# Patient Record
Sex: Female | Born: 1935 | Race: White | Hispanic: Refuse to answer | Smoking: Never smoker
Health system: Southern US, Community
[De-identification: ages and names within clinical notes are randomized; demographics above are authoritative.]

## PROBLEM LIST (undated history)

## (undated) DIAGNOSIS — M1990 Unspecified osteoarthritis, unspecified site: Secondary | ICD-10-CM

## (undated) DIAGNOSIS — K219 Gastro-esophageal reflux disease without esophagitis: Secondary | ICD-10-CM

## (undated) DIAGNOSIS — M549 Dorsalgia, unspecified: Secondary | ICD-10-CM

## (undated) DIAGNOSIS — F419 Anxiety disorder, unspecified: Secondary | ICD-10-CM

## (undated) DIAGNOSIS — I499 Cardiac arrhythmia, unspecified: Secondary | ICD-10-CM

## (undated) DIAGNOSIS — E079 Disorder of thyroid, unspecified: Secondary | ICD-10-CM

## (undated) HISTORY — PX: ABDOMINAL HYSTERECTOMY: SHX81

## (undated) HISTORY — PX: COLONOSCOPY: SHX174

## (undated) HISTORY — PX: CARDIAC SURGERY: SHX584

## (undated) HISTORY — PX: INNER EAR SURGERY: SHX679

## (undated) HISTORY — PX: WRIST SURGERY: SHX841

## (undated) HISTORY — PX: EYE SURGERY: SHX253

---

## 1990-11-14 HISTORY — PX: BREAST BIOPSY: SHX20

## 2004-11-05 ENCOUNTER — Ambulatory Visit: Payer: Self-pay | Admitting: Family Medicine

## 2005-12-23 ENCOUNTER — Ambulatory Visit: Payer: Self-pay | Admitting: Family Medicine

## 2007-01-01 ENCOUNTER — Ambulatory Visit: Payer: Self-pay | Admitting: Family Medicine

## 2008-01-14 ENCOUNTER — Ambulatory Visit: Payer: Self-pay | Admitting: Family Medicine

## 2009-01-19 ENCOUNTER — Ambulatory Visit: Payer: Self-pay | Admitting: Family Medicine

## 2009-10-23 ENCOUNTER — Ambulatory Visit: Payer: Self-pay | Admitting: Internal Medicine

## 2010-01-22 ENCOUNTER — Ambulatory Visit: Payer: Self-pay | Admitting: Family Medicine

## 2010-11-25 HISTORY — PX: DG  BONE DENSITY (ARMC HX): HXRAD1102

## 2011-01-24 ENCOUNTER — Ambulatory Visit: Payer: Self-pay | Admitting: Internal Medicine

## 2011-01-24 ENCOUNTER — Ambulatory Visit: Payer: Self-pay | Admitting: Family Medicine

## 2011-02-21 ENCOUNTER — Ambulatory Visit: Payer: Self-pay | Admitting: Internal Medicine

## 2011-02-24 ENCOUNTER — Ambulatory Visit: Payer: Self-pay | Admitting: Internal Medicine

## 2011-03-26 ENCOUNTER — Ambulatory Visit: Payer: Self-pay | Admitting: Internal Medicine

## 2011-05-28 ENCOUNTER — Ambulatory Visit: Payer: Self-pay | Admitting: Internal Medicine

## 2011-06-26 ENCOUNTER — Ambulatory Visit: Payer: Self-pay | Admitting: Internal Medicine

## 2012-01-28 ENCOUNTER — Ambulatory Visit: Payer: Self-pay | Admitting: Family Medicine

## 2013-01-28 ENCOUNTER — Ambulatory Visit: Payer: Self-pay | Admitting: Family Medicine

## 2013-07-25 ENCOUNTER — Ambulatory Visit: Payer: Self-pay | Admitting: Family Medicine

## 2013-07-27 LAB — BETA STREP CULTURE(ARMC)

## 2014-02-01 ENCOUNTER — Ambulatory Visit: Payer: Self-pay | Admitting: Family Medicine

## 2015-02-07 ENCOUNTER — Ambulatory Visit: Payer: Self-pay | Admitting: Internal Medicine

## 2015-04-16 ENCOUNTER — Encounter: Payer: Self-pay | Admitting: Gynecology

## 2015-04-16 ENCOUNTER — Ambulatory Visit: Payer: Medicare Other

## 2015-04-16 ENCOUNTER — Ambulatory Visit
Admission: EM | Admit: 2015-04-16 | Discharge: 2015-04-16 | Disposition: A | Discharge status: Home / Self Care | Payer: Medicare Other | Attending: Family Medicine | Admitting: Family Medicine

## 2015-04-16 DIAGNOSIS — T148XXA Other injury of unspecified body region, initial encounter: Secondary | ICD-10-CM

## 2015-04-16 DIAGNOSIS — W19XXXA Unspecified fall, initial encounter: Secondary | ICD-10-CM | POA: Diagnosis not present

## 2015-04-16 DIAGNOSIS — T148 Other injury of unspecified body region: Secondary | ICD-10-CM

## 2015-04-16 DIAGNOSIS — M545 Low back pain: Secondary | ICD-10-CM | POA: Diagnosis present

## 2015-04-16 DIAGNOSIS — S300XXA Contusion of lower back and pelvis, initial encounter: Secondary | ICD-10-CM | POA: Diagnosis not present

## 2015-04-16 HISTORY — DX: Gastro-esophageal reflux disease without esophagitis: K21.9

## 2015-04-16 HISTORY — DX: Unspecified osteoarthritis, unspecified site: M19.90

## 2015-04-16 HISTORY — DX: Anxiety disorder, unspecified: F41.9

## 2015-04-16 HISTORY — DX: Dorsalgia, unspecified: M54.9

## 2015-04-16 HISTORY — DX: Disorder of thyroid, unspecified: E07.9

## 2015-04-16 HISTORY — DX: Cardiac arrhythmia, unspecified: I49.9

## 2015-04-16 NOTE — Discharge Instructions (Signed)
Ice when necessary, cushion when sitting, Tylenol/Aleve when necessary, seek medical attention if symptoms persist or worsen as discussed.
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
## 2015-09-26 ENCOUNTER — Encounter: Payer: Self-pay | Admitting: Internal Medicine

## 2015-09-26 DIAGNOSIS — M47816 Spondylosis without myelopathy or radiculopathy, lumbar region: Secondary | ICD-10-CM | POA: Insufficient documentation

## 2015-09-26 DIAGNOSIS — E039 Hypothyroidism, unspecified: Secondary | ICD-10-CM | POA: Insufficient documentation

## 2015-09-26 DIAGNOSIS — D72819 Decreased white blood cell count, unspecified: Secondary | ICD-10-CM | POA: Insufficient documentation

## 2015-09-26 DIAGNOSIS — H3530 Unspecified macular degeneration: Secondary | ICD-10-CM | POA: Insufficient documentation

## 2015-09-26 DIAGNOSIS — IMO0001 Reserved for inherently not codable concepts without codable children: Secondary | ICD-10-CM | POA: Insufficient documentation

## 2015-09-26 DIAGNOSIS — F411 Generalized anxiety disorder: Secondary | ICD-10-CM | POA: Insufficient documentation

## 2015-09-26 DIAGNOSIS — E785 Hyperlipidemia, unspecified: Secondary | ICD-10-CM | POA: Insufficient documentation

## 2015-09-26 DIAGNOSIS — K219 Gastro-esophageal reflux disease without esophagitis: Secondary | ICD-10-CM | POA: Insufficient documentation

## 2015-09-26 DIAGNOSIS — R030 Elevated blood-pressure reading, without diagnosis of hypertension: Secondary | ICD-10-CM

## 2015-09-26 DIAGNOSIS — N6019 Diffuse cystic mastopathy of unspecified breast: Secondary | ICD-10-CM | POA: Insufficient documentation

## 2015-11-21 ENCOUNTER — Ambulatory Visit
Admission: EM | Admit: 2015-11-21 | Discharge: 2015-11-21 | Disposition: A | Discharge status: Home / Self Care | Payer: Medicare Other | Attending: Family Medicine | Admitting: Family Medicine

## 2015-11-21 DIAGNOSIS — R030 Elevated blood-pressure reading, without diagnosis of hypertension: Secondary | ICD-10-CM | POA: Diagnosis not present

## 2015-11-21 DIAGNOSIS — R309 Painful micturition, unspecified: Secondary | ICD-10-CM | POA: Insufficient documentation

## 2015-11-21 DIAGNOSIS — N390 Urinary tract infection, site not specified: Secondary | ICD-10-CM | POA: Insufficient documentation

## 2015-11-21 DIAGNOSIS — IMO0001 Reserved for inherently not codable concepts without codable children: Secondary | ICD-10-CM

## 2015-11-21 LAB — URINALYSIS COMPLETE WITH MICROSCOPIC (ARMC ONLY)
BILIRUBIN URINE: NEGATIVE
Glucose, UA: NEGATIVE mg/dL
Ketones, ur: NEGATIVE mg/dL
Nitrite: NEGATIVE
Protein, ur: NEGATIVE mg/dL
Specific Gravity, Urine: 1.02 (ref 1.005–1.030)
pH: 6 (ref 5.0–8.0)

## 2015-11-21 MED ORDER — PHENAZOPYRIDINE HCL 200 MG PO TABS: 200.0000 mg | ORAL_TABLET | Freq: Three times a day (TID) | ORAL | Status: DC | PRN

## 2015-11-21 MED ORDER — NITROFURANTOIN MONOHYD MACRO 100 MG PO CAPS: 100.0000 mg | ORAL_CAPSULE | Freq: Two times a day (BID) | ORAL | Status: AC

## 2015-11-21 NOTE — ED Provider Notes (Addendum)
CSN: [id number]     Arrival date & time 11/21/15  0901 History   First MD Initiated Contact with Patient 11/21/15 [phone]    Nurses notes were reviewed. Chief Complaint  Patient presents with  . Urinary Tract Infection   patient's here because of UTI. She reports not having a UTI in quite a while. She does admit to some time wiping out and I recommend that she always write down. States this started about Christmas Eve and for 3 days as been intermittent but today she woke up and has been much stronger and more persistent. (Consider location/radiation/quality/duration/timing/severity/associated sxs/prior Treatment) Patient is a 81 y.o. female presenting with urinary tract infection. The history is provided by the patient. No language interpreter was used.  Urinary Tract Infection Pain quality:  Sharp and burning Pain severity:  Moderate Progression:  Worsening Chronicity:  New Recent urinary tract infections: no   Relieved by:  Nothing Worsened by:  Nothing tried Urinary symptoms: frequent urination   Associated symptoms: no abdominal pain, no fever and no vaginal discharge   Risk factors: no hx of pyelonephritis, no kidney transplant, not pregnant, not sexually active and no sexually transmitted infections     Past Medical History  Diagnosis Date  . Thyroid disease   . GERD (gastroesophageal reflux disease)   . Anxiety   . Back pain     chronic  . Arthritis     hands, knees and back  . Arrhythmia    Past Surgical History  Procedure Laterality Date  . Breast biopsy Right     benign  . Wrist surgery Right   . Abdominal hysterectomy      total for endometriosis  . Cardiac surgery      catheterization  . Inner ear surgery Bilateral     cochlear implants  . Colonoscopy    . Dg  bone density ([facility] hx)  2012  . Eye surgery      bilateral catatact   Family History  Problem Relation Age of Onset  . Breast cancer Sister   . CAD Father    Social History  Substance Use Topics   . Smoking status: Never Smoker   . Smokeless tobacco: None  . Alcohol Use: No   OB History    No data available     Review of Systems  Constitutional: Negative for fever.  Gastrointestinal: Negative for abdominal pain.  Genitourinary: Negative for vaginal discharge.    Allergies  Raloxifene  Home Medications   Prior to Admission medications   Medication Sig Start Date End Date Taking? Authorizing Provider  ALPRAZolam Prudy Feeler) 0.25 MG tablet Take 0.25 mg by mouth at bedtime as needed for anxiety.   Yes Historical Provider, MD  aspirin 81 MG tablet Take 81 mg by mouth daily.   Yes Historical Provider, MD  calcium acetate (PHOSLO) 667 MG capsule Take by mouth 3 (three) times daily with meals.   Yes Historical Provider, MD  levothyroxine (SYNTHROID, LEVOTHROID) 75 MCG tablet Take 75 mcg by mouth daily before breakfast.   Yes Historical Provider, MD  Multiple Vitamins-Minerals (RA VISION-VITE PRESERVE PO) Take by mouth.   Yes Historical Provider, MD  omeprazole (PRILOSEC) 40 MG capsule Take 40 mg by mouth daily.   Yes Historical Provider, MD  TURMERIC PO Take by mouth.   Yes Historical Provider, MD  nitrofurantoin, macrocrystal-monohydrate, (MACROBID) 100 MG capsule Take 1 capsule (100 mg total) by mouth 2 (two) times daily. 11/21/15   Hassan Rowan, MD  phenazopyridine (  PYRIDIUM) 200 MG tablet Take 1 tablet (200 mg total) by mouth 3 (three) times daily as needed for pain. 11/21/15   Hassan RowanEugene Naevia Unterreiner, MD   Meds Ordered and Administered this Visit  Medications - No data to display  BP 172/98 mmHg  Pulse 72  Temp(Src) 96.6 F (35.9 C) (Tympanic)  Resp 16  Ht 5\' 3"  (1.6 m)  Wt 136 lb (61.689 kg)  BMI 24.10 kg/m2  SpO2 98% No data found.   Physical Exam  Constitutional: She is oriented to person, place, and time. She appears well-developed and well-nourished.  HENT:  Head: Normocephalic.  Eyes: Conjunctivae are normal. Pupils are equal, round, and reactive to light.  Neck: Neck  supple.  Abdominal: Bowel sounds are normal. She exhibits no distension. There is no tenderness.  Musculoskeletal: Normal range of motion.  Neurological: She is alert and oriented to person, place, and time.  Skin: Skin is warm and dry.  Psychiatric: She has a normal mood and affect. Her behavior is normal.  Vitals reviewed.   ED Course  Procedures (including critical care time)  Labs Review Labs Reviewed  URINALYSIS COMPLETEWITH MICROSCOPIC ([facility] ONLY) - Abnormal; Notable for the following:    APPearance CLOUDY (*)    Hgb urine dipstick 3+ (*)    Leukocytes, UA 3+ (*)    Bacteria, UA FEW (*)    Squamous Epithelial / LPF 0-5 (*)    All other components within normal limits  URINE CULTURE    Imaging Review No results found.   Visual Acuity Review  Right Eye Distance:   Left Eye Distance:   Bilateral Distance:    Right Eye Near:   Left Eye Near:    Bilateral Near:        Results for orders placed or performed during the hospital encounter of 11/21/15  Urinalysis complete, with microscopic  Result Value Ref Range   Color, Urine YELLOW YELLOW   APPearance CLOUDY (A) CLEAR   Glucose, UA NEGATIVE NEGATIVE mg/dL   Bilirubin Urine NEGATIVE NEGATIVE   Ketones, ur NEGATIVE NEGATIVE mg/dL   Specific Gravity, Urine 1.020 1.005 - 1.030   Hgb urine dipstick 3+ (A) NEGATIVE   pH 6.0 5.0 - 8.0   Protein, ur NEGATIVE NEGATIVE mg/dL   Nitrite NEGATIVE NEGATIVE   Leukocytes, UA 3+ (A) NEGATIVE   RBC / HPF TOO NUMEROUS TO COUNT 0 - 5 RBC/hpf   WBC, UA 6-30 0 - 5 WBC/hpf   Bacteria, UA FEW (A) NONE SEEN   Squamous Epithelial / LPF 0-5 (A) NONE SEEN   Ca Oxalate Crys, UA PRESENT      MDM   1. UTI (lower urinary tract infection)   2. Blood pressure elevated       UTI will be treated with Cipro or Macrobid depending on the pH of the urine for week. And we'll add Pyridium 200 mg 3 times a day as mentioned before recommend that she always wipes down.  Patients BP was  also elevated. Will recheck before leaving.   Blood pressure was rechecked several times and still remains elevated. I've asked the nurse to recommend that she follow-up with PCP to reevaluate elevated blood pressure in a few weeks.     Hassan RowanEugene Carole Doner, MD 11/21/15 1013  Hassan RowanEugene Asier Desroches, MD 11/21/15 [id number]

## 2015-11-21 NOTE — Discharge Instructions (Signed)
Antibiotic Medicine Antibiotic medicines are used to treat infections caused by bacteria. They work by hurting or killing the germs that are making you sick. HOW WILL MY MEDICINE BE PICKED? There are many kinds of antibiotic medicines. To help your doctor pick one, tell your doctor if:  You have any allergies.  You are pregnant or plan to get pregnant.  You are breastfeeding.  You are taking any medicines. These include over-the-counter medicines, prescription medicines, and herbal remedies.  You have a medical condition or problem. If you have questions about why your medicine was picked, ask. FOR HOW LONG SHOULD I TAKE MY MEDICINE? Take your medicine for as long as your doctor tells you to. Do not stop taking it when you feel better. If you stop taking it too soon:  You may start to feel sick again.  Your infection may get harder to treat.  New problems may develop. WHAT IF I MISS A DOSE? Try not to miss any doses of antibiotic medicine. If you miss a dose:  Take the dose as soon as you can.  If you are taking 2 doses a day, take the next dose in 5 to 6 hours.  If you are taking 3 or more doses a day, take the next dose in 2 to 4 hours. Then go back to the normal schedule. If you cannot take a missed dose, take the next dose on time. Then take the missed dose after you have taken all the doses as told by your doctor, as if you had one more dose left. DOES THIS MEDICINE AFFECT BIRTH CONTROL? Birth control pills may not work while you are on antibiotic medicines. If you are taking birth control pills, keep taking them as usual. Use a second form of birth control, such as a condom. Keep using the second form of birth control until you are finished with your current 1 month cycle of birth control pills. GET HELP IF:  You get worse.  You do not feel better a few days after starting the medicine.  You throw up (vomit).  There are white patches in your mouth.  You have new  joint pain after starting the medicine.  You have new muscle aches after starting the medicine.  You had a fever before starting the medicine, and it comes back.  You have any symptoms of an allergic reaction, such as an itchy rash. If this happens, stop taking the medicine. GET HELP RIGHT AWAY IF:  Your pee (urine) turns dark or becomes blood-colored.  Your skin turns yellow.  You bruise or bleed easily.  You have very bad watery poop (diarrhea) and cramps in your belly (abdomen).  You have a very bad headache.  You have signs of a very bad allergic reaction, such as:  Trouble breathing.  Wheezing.  Swelling of the lips, tongue, or face.  Fainting.  Blisters on the skin or in the mouth. If you have signs of a very bad allergic reaction, stop taking the antibiotic medicine right away.   This information is not intended to replace advice given to you by your health care provider. Make sure you discuss any questions you have with your health care provider.   Document Released: 08/20/2008 Document Revised: 08/02/2015 Document Reviewed: 03/29/2015 Elsevier Interactive Patient Education 2016 [employer]  How to Take Your Blood Pressure HOW DO I GET A BLOOD PRESSURE MACHINE?  You can buy an electronic home blood pressure machine at your local pharmacy. Insurance will sometimes  cover the cost if you have a prescription.  Ask your doctor what type of machine is best for you. There are different machines for your arm and your wrist.  If you decide to buy a machine to check your blood pressure on your arm, first check the size of your arm so you can buy the right size cuff. To check the size of your arm:   Use a measuring tape that shows both inches and centimeters.   Wrap the measuring tape around the upper-middle part of your arm. You may need someone to help you measure.   Write down your arm measurement in both inches and centimeters.   To measure your blood  pressure correctly, it is important to have the right size cuff.   If your arm is up to 13 inches (up to 34 centimeters), get an adult cuff size.  If your arm is 13 to 17 inches (35 to 44 centimeters), get a large adult cuff size.    If your arm is 17 to 20 inches (45 to 52 centimeters), get an adult thigh cuff.  WHAT DO THE NUMBERS MEAN?   There are two numbers that make up your blood pressure. For example: 120/80.  The first number (120 in our example) is called the "systolic pressure." It is a measure of the pressure in your blood vessels when your heart is pumping blood.  The second number (80 in our example) is called the "diastolic pressure." It is a measure of the pressure in your blood vessels when your heart is resting between beats.  Your doctor will tell you what your blood pressure should be. WHAT SHOULD I DO BEFORE I CHECK MY BLOOD PRESSURE?   Try to rest or relax for at least 30 minutes before you check your blood pressure.  Do not smoke.  Do not have any drinks with caffeine, such as:  Soda.  Coffee.  Tea.  Check your blood pressure in a quiet room.  Sit down and stretch out your arm on a table. Keep your arm at about the level of your heart. Let your arm relax.  Make sure that your legs are not crossed. HOW DO I CHECK MY BLOOD PRESSURE?  Follow the directions that came with your machine.  Make sure you remove any tight-fitting clothing from your arm or wrist. Wrap the cuff around your upper arm or wrist. You should be able to fit a finger between the cuff and your arm. If you cannot fit a finger between the cuff and your arm, it is too tight and should be removed and rewrapped.  Some units require you to manually pump up the arm cuff.  Automatic units inflate the cuff when you press a button.  Cuff deflation is automatic in both models.  After the cuff is inflated, the unit measures your blood pressure and pulse. The readings are shown on a monitor.  Hold still and breathe normally while the cuff is inflated.  Getting a reading takes less than a minute.  Some models store readings in a memory. Some provide a printout of readings. If your machine does not store your readings, keep a written record.  Take readings with you to your next visit with your doctor.   This information is not intended to replace advice given to you by your health care provider. Make sure you discuss any questions you have with your health care provider.   Document Released: 10/24/2008 Document Revised: 12/02/2014 Document Reviewed: 01/06/2014  Elsevier Interactive Patient Education [employer]   Urinary Tract Infection A urinary tract infection (UTI) can occur any place along the urinary tract. The tract includes the kidneys, ureters, bladder, and urethra. A type of germ called bacteria often causes a UTI. UTIs are often helped with antibiotic medicine.  HOME CARE   If given, take antibiotics as told by your doctor. Finish them even if you start to feel better.  Drink enough fluids to keep your pee (urine) clear or pale yellow.  Avoid tea, drinks with caffeine, and bubbly (carbonated) drinks.  Pee often. Avoid holding your pee in for a long time.  Pee before and after having sex (intercourse).  Wipe from front to back after you poop (bowel movement) if you are a woman. Use each tissue only once. GET HELP RIGHT AWAY IF:   You have back pain.  You have lower belly (abdominal) pain.  You have chills.  You feel sick to your stomach (nauseous).  You throw up (vomit).  Your burning or discomfort with peeing does not go away.  You have a fever.  Your symptoms are not better in 3 days. MAKE SURE YOU:   Understand these instructions.  Will watch your condition.  Will get help right away if you are not doing well or get worse.   This information is not intended to replace advice given to you by your health care provider. Make sure you  discuss any questions you have with your health care provider.   Document Released: 04/29/2008 Document Revised: 12/02/2014 Document Reviewed: 06/11/2012 Elsevier Interactive Patient Education [employer]

## 2015-11-21 NOTE — ED Notes (Signed)
Started 2 days ago with burning with voiding. Worsening symptoms this am. Hx of UTI's "years ago".

## 2015-11-23 LAB — URINE CULTURE: Culture: >=100000

## 2016-01-09 ENCOUNTER — Other Ambulatory Visit: Payer: Self-pay | Admitting: Family Medicine

## 2016-01-09 DIAGNOSIS — Z1231 Encounter for screening mammogram for malignant neoplasm of breast: Secondary | ICD-10-CM

## 2016-02-08 ENCOUNTER — Ambulatory Visit
Admission: RE | Admit: 2016-02-08 | Discharge: 2016-02-08 | Disposition: A | Discharge status: Home / Self Care | Payer: Medicare Other | Source: Ambulatory Visit | Attending: Family Medicine | Admitting: Family Medicine

## 2016-02-08 DIAGNOSIS — Z1231 Encounter for screening mammogram for malignant neoplasm of breast: Secondary | ICD-10-CM | POA: Diagnosis present

## 2017-01-01 ENCOUNTER — Other Ambulatory Visit: Payer: Self-pay | Admitting: Family Medicine

## 2017-01-01 DIAGNOSIS — Z1231 Encounter for screening mammogram for malignant neoplasm of breast: Secondary | ICD-10-CM

## 2017-02-11 ENCOUNTER — Ambulatory Visit
Admission: RE | Admit: 2017-02-11 | Discharge: 2017-02-11 | Disposition: A | Discharge status: Home / Self Care | Payer: Medicare Other | Source: Ambulatory Visit | Attending: Family Medicine | Admitting: Family Medicine

## 2017-02-11 ENCOUNTER — Other Ambulatory Visit: Payer: Self-pay | Admitting: Family Medicine

## 2017-02-11 DIAGNOSIS — Z1231 Encounter for screening mammogram for malignant neoplasm of breast: Secondary | ICD-10-CM

## 2018-01-12 ENCOUNTER — Other Ambulatory Visit: Payer: Self-pay | Admitting: Family Medicine

## 2018-01-12 DIAGNOSIS — Z1231 Encounter for screening mammogram for malignant neoplasm of breast: Secondary | ICD-10-CM

## 2018-02-16 ENCOUNTER — Ambulatory Visit
Admission: RE | Admit: 2018-02-16 | Discharge: 2018-02-16 | Disposition: A | Discharge status: Home / Self Care | Payer: Medicare Other | Source: Ambulatory Visit | Attending: Family Medicine | Admitting: Family Medicine

## 2018-02-16 DIAGNOSIS — Z1231 Encounter for screening mammogram for malignant neoplasm of breast: Secondary | ICD-10-CM

## 2018-02-17 ENCOUNTER — Other Ambulatory Visit: Payer: Self-pay | Admitting: Family Medicine

## 2018-02-17 DIAGNOSIS — N63 Unspecified lump in unspecified breast: Secondary | ICD-10-CM

## 2018-02-17 DIAGNOSIS — N6310 Unspecified lump in the right breast, unspecified quadrant: Secondary | ICD-10-CM

## 2018-02-17 DIAGNOSIS — Z1239 Encounter for other screening for malignant neoplasm of breast: Secondary | ICD-10-CM

## 2018-03-02 ENCOUNTER — Ambulatory Visit
Admission: RE | Admit: 2018-03-02 | Discharge: 2018-03-02 | Disposition: A | Discharge status: Home / Self Care | Payer: Medicare Other | Source: Ambulatory Visit | Attending: Family Medicine | Admitting: Family Medicine

## 2018-03-02 DIAGNOSIS — Z1239 Encounter for other screening for malignant neoplasm of breast: Secondary | ICD-10-CM

## 2018-03-02 DIAGNOSIS — N6310 Unspecified lump in the right breast, unspecified quadrant: Secondary | ICD-10-CM | POA: Diagnosis present

## 2018-03-02 DIAGNOSIS — Z1231 Encounter for screening mammogram for malignant neoplasm of breast: Secondary | ICD-10-CM | POA: Insufficient documentation

## 2018-03-02 DIAGNOSIS — N63 Unspecified lump in unspecified breast: Secondary | ICD-10-CM

## 2018-03-30 ENCOUNTER — Other Ambulatory Visit: Payer: Self-pay | Admitting: Family Medicine

## 2018-03-30 DIAGNOSIS — N63 Unspecified lump in unspecified breast: Secondary | ICD-10-CM

## 2018-04-01 ENCOUNTER — Other Ambulatory Visit: Payer: Self-pay | Admitting: Family Medicine

## 2018-04-01 DIAGNOSIS — N63 Unspecified lump in unspecified breast: Secondary | ICD-10-CM

## 2018-04-25 ENCOUNTER — Ambulatory Visit
Admission: EM | Admit: 2018-04-25 | Discharge: 2018-04-25 | Disposition: A | Discharge status: Home / Self Care | Payer: Medicare Other | Attending: Emergency Medicine | Admitting: Emergency Medicine

## 2018-04-25 DIAGNOSIS — Z79899 Other long term (current) drug therapy: Secondary | ICD-10-CM | POA: Insufficient documentation

## 2018-04-25 DIAGNOSIS — Z803 Family history of malignant neoplasm of breast: Secondary | ICD-10-CM | POA: Insufficient documentation

## 2018-04-25 DIAGNOSIS — Z888 Allergy status to other drugs, medicaments and biological substances status: Secondary | ICD-10-CM | POA: Diagnosis not present

## 2018-04-25 DIAGNOSIS — Z7982 Long term (current) use of aspirin: Secondary | ICD-10-CM | POA: Diagnosis not present

## 2018-04-25 DIAGNOSIS — F419 Anxiety disorder, unspecified: Secondary | ICD-10-CM | POA: Insufficient documentation

## 2018-04-25 DIAGNOSIS — E039 Hypothyroidism, unspecified: Secondary | ICD-10-CM | POA: Insufficient documentation

## 2018-04-25 DIAGNOSIS — Z9889 Other specified postprocedural states: Secondary | ICD-10-CM | POA: Diagnosis not present

## 2018-04-25 DIAGNOSIS — E785 Hyperlipidemia, unspecified: Secondary | ICD-10-CM | POA: Diagnosis not present

## 2018-04-25 DIAGNOSIS — K219 Gastro-esophageal reflux disease without esophagitis: Secondary | ICD-10-CM | POA: Insufficient documentation

## 2018-04-25 DIAGNOSIS — Z90710 Acquired absence of both cervix and uterus: Secondary | ICD-10-CM | POA: Diagnosis not present

## 2018-04-25 DIAGNOSIS — Z8249 Family history of ischemic heart disease and other diseases of the circulatory system: Secondary | ICD-10-CM | POA: Insufficient documentation

## 2018-04-25 DIAGNOSIS — R300 Dysuria: Secondary | ICD-10-CM | POA: Insufficient documentation

## 2018-04-25 LAB — URINALYSIS, COMPLETE (UACMP) WITH MICROSCOPIC
Bilirubin Urine: NEGATIVE
Glucose, UA: NEGATIVE mg/dL
Ketones, ur: NEGATIVE mg/dL
Nitrite: NEGATIVE
Protein, ur: NEGATIVE mg/dL
RBC / HPF: >50 RBC/hpf (ref 0–5)
Specific Gravity, Urine: 1.02 (ref 1.005–1.030)
WBC, UA: >50 WBC/hpf (ref 0–5)
pH: 5.5 (ref 5.0–8.0)

## 2018-04-25 MED ORDER — PHENAZOPYRIDINE HCL 200 MG PO TABS: 200.0000 mg | ORAL_TABLET | Freq: Three times a day (TID) | ORAL | 0 refills | Status: AC

## 2018-04-25 MED ORDER — CEPHALEXIN 500 MG PO CAPS: 500.0000 mg | ORAL_CAPSULE | Freq: Two times a day (BID) | ORAL | 0 refills | Status: AC

## 2018-04-25 NOTE — ED Provider Notes (Signed)
[facility]    CSN: [id number] Arrival date & time: 04/25/18  0807     History   Chief Complaint Chief Complaint  Patient presents with  . Dysuria    HPI Monica Newman is a 84 y.o. female.   HPI  84 year old female presents with dysuria that started yesterday.  She has frequency urgency with the dysuria which she describes as burning towards the end of micturition. States That she takes cranberry supplement on a daily basis but despite this  Feels like a urinary tract infection.  Similar UTI last year which grew 100,000 colonies of E. coli. Denies any fever or chills.  She has had no nausea or vomiting.  She denies any vaginal discharge.        Past Medical History:  Diagnosis Date  . Anxiety   . Arrhythmia   . Arthritis    hands, knees and back  . Back pain    chronic  . GERD (gastroesophageal reflux disease)   . Thyroid disease     Patient Active Problem List   Diagnosis Date Noted  . Acquired hypothyroidism 09/26/2015  . Blood pressure elevated 09/26/2015  . Bloodgood disease 09/26/2015  . Gastro-esophageal reflux disease without esophagitis 09/26/2015  . Anxiety, generalized 09/26/2015  . HLD (hyperlipidemia) 09/26/2015  . Decreased leukocytes 09/26/2015  . Degeneration macular 09/26/2015  . Degenerative arthritis of lumbar spine 09/26/2015    Past Surgical History:  Procedure Laterality Date  . ABDOMINAL HYSTERECTOMY     total for endometriosis  . BREAST BIOPSY Right 11/14/90   benign  . CARDIAC SURGERY     catheterization  . COLONOSCOPY    . DG  BONE DENSITY ([facility] HX)  2012  . EYE SURGERY     bilateral catatact  . INNER EAR SURGERY Bilateral    cochlear implants  . WRIST SURGERY Right     OB History   None      Home Medications    Prior to Admission medications   Medication Sig Start Date End Date Taking? Authorizing Provider  ALPRAZolam Prudy Feeler) 0.25 MG tablet Take 0.25 mg by mouth at bedtime as needed for  anxiety.   Yes [provider]  aspirin 81 MG tablet Take 81 mg by mouth daily.   Yes [provider]  calcium acetate (PHOSLO) 667 MG capsule Take by mouth 3 (three) times daily with meals.   Yes [provider]  levothyroxine (SYNTHROID, LEVOTHROID) 75 MCG tablet Take 75 mcg by mouth daily before breakfast.   Yes [provider]  Multiple Vitamins-Minerals (RA VISION-VITE PRESERVE PO) Take by mouth.   Yes [provider]  omeprazole (PRILOSEC) 40 MG capsule Take 40 mg by mouth daily.   Yes [provider]  TURMERIC PO Take by mouth.   Yes [provider]  cephALEXin (KEFLEX) 500 MG capsule Take 1 capsule (500 mg total) by mouth 2 (two) times daily. 04/25/18   Lutricia Feil, PA-C  nitrofurantoin, macrocrystal-monohydrate, (MACROBID) 100 MG capsule Take 1 capsule (100 mg total) by mouth 2 (two) times daily. 11/21/15   Hassan Rowan, MD  phenazopyridine (PYRIDIUM) 200 MG tablet Take 1 tablet (200 mg total) by mouth 3 (three) times daily. 04/25/18   Lutricia Feil, PA-C    Family History Family History  Problem Relation Age of Onset  . Breast cancer Sister 9        2 times  age 69 and 2  . CAD Father  Social History Social History   Tobacco Use  . Smoking status: Never Smoker  . Smokeless tobacco: Never Used  Substance Use Topics  . Alcohol use: No  . Drug use: No     Allergies   Raloxifene   Review of Systems Review of Systems  Constitutional: Positive for activity change. Negative for appetite change, chills, fatigue and fever.  Genitourinary: Positive for dysuria, frequency and urgency. Negative for vaginal bleeding and vaginal discharge.  All other systems reviewed and are negative.    Physical Exam Triage Vital Signs ED Triage Vitals  Enc Vitals Group     BP 04/25/18 0814 (!) 154/74     Pulse Rate 04/25/18 0814 62     Resp 04/25/18 0814 18     Temp 04/25/18 0814 97.9 F (36.6 C)     Temp  Source 04/25/18 0814 Oral     SpO2 04/25/18 0814 98 %     Weight --      Height --      Head Circumference --      Peak Flow --      Pain Score 04/25/18 0816 0     Pain Loc --      Pain Edu? --      Excl. in GC? --    No data found.  Updated Vital Signs BP (!) 154/74 (BP Location: Left Arm)   Pulse 62   Temp 97.9 F (36.6 C) (Oral)   Resp 18   SpO2 98%   Visual Acuity Right Eye Distance:   Left Eye Distance:   Bilateral Distance:    Right Eye Near:   Left Eye Near:    Bilateral Near:     Physical Exam  Constitutional: She is oriented to person, place, and time. She appears well-developed and well-nourished. No distress.  HENT:  Head: Normocephalic.  Eyes: Pupils are equal, round, and reactive to light. Right eye exhibits no discharge. Left eye exhibits no discharge.  Neck: Normal range of motion.  Abdominal: Soft. Bowel sounds are normal.  Musculoskeletal: Normal range of motion.  Neurological: She is alert and oriented to person, place, and time.  Skin: Skin is warm and dry. She is not diaphoretic.  Psychiatric: She has a normal mood and affect. Her behavior is normal. Judgment and thought content normal.  Nursing note and vitals reviewed.    UC Treatments / Results  Labs (all labs ordered are listed, but only abnormal results are displayed) Labs Reviewed  URINALYSIS, COMPLETE (UACMP) WITH MICROSCOPIC - Abnormal; Notable for the following components:      Result Value   APPearance CLOUDY (*)    Hgb urine dipstick MODERATE (*)    Leukocytes, UA LARGE (*)    Bacteria, UA MANY (*)    All other components within normal limits  URINE CULTURE    EKG None  Radiology No results found.  Procedures Procedures (including critical care time)  Medications Ordered in UC Medications - No data to display  Initial Impression / Assessment and Plan / UC Course  I have reviewed the triage vital signs and the nursing notes.  Pertinent labs & imaging results that  were available during my care of the patient were reviewed by me and considered in my medical decision making (see chart for details).     Plan: 1. Test/x-ray results and diagnosis reviewed with patient 2. rx as per orders; risks, benefits, potential side effects reviewed with patient 3. Recommend supportive treatment with fluids.  Complete  the antibiotics as prescribed.  Cultures and sensitivities will be available in 48 hours.  If you have any further problems follow-up with your primary care physician 4. F/u prn if symptoms worsen or don't improve  Final Clinical Impressions(s) / UC Diagnoses   Final diagnoses:  Dysuria   Discharge Instructions   None    ED Prescriptions    Medication Sig Dispense Auth. Provider   cephALEXin (KEFLEX) 500 MG capsule Take 1 capsule (500 mg total) by mouth 2 (two) times daily. 10 capsule Ovid Curdoemer, Md Smola P, PA-C   phenazopyridine (PYRIDIUM) 200 MG tablet Take 1 tablet (200 mg total) by mouth 3 (three) times daily. 6 tablet Lutricia Feiloemer, Jeovanny Cuadros P, PA-C     Controlled Substance Prescriptions [place] Controlled Substance Registry consulted? Not Applicable   Lutricia FeilRoemer, [facility] P, PA-C 04/25/18 [id number]

## 2018-04-25 NOTE — ED Triage Notes (Signed)
Pt here for dysuria that started yesterday. No other urinary complaints. Did take tylenol. Also states she has a cold as well.

## 2018-04-28 LAB — URINE CULTURE: Culture: >=100000 — AB

## 2018-04-29 ENCOUNTER — Telehealth (HOSPITAL_COMMUNITY): Payer: Self-pay

## 2018-04-29 NOTE — Telephone Encounter (Signed)
Urine culture positive for E.Coli and was treated Keflex at urgent care visit. Pt called and is not available at this time. Encouraged call back for any questions.

## 2018-06-03 ENCOUNTER — Ambulatory Visit
Admission: RE | Admit: 2018-06-03 | Discharge: 2018-06-03 | Disposition: A | Discharge status: Home / Self Care | Payer: Medicare Other | Source: Ambulatory Visit | Attending: Family Medicine | Admitting: Family Medicine

## 2018-06-03 DIAGNOSIS — N6310 Unspecified lump in the right breast, unspecified quadrant: Secondary | ICD-10-CM | POA: Insufficient documentation

## 2018-06-03 DIAGNOSIS — N63 Unspecified lump in unspecified breast: Secondary | ICD-10-CM

## 2018-06-15 ENCOUNTER — Emergency Department
Admission: EM | Admit: 2018-06-15 | Discharge: 2018-06-15 | Disposition: A | Discharge status: Home / Self Care | Payer: Medicare Other | Attending: Emergency Medicine | Admitting: Emergency Medicine

## 2018-06-15 ENCOUNTER — Emergency Department: Payer: Medicare Other

## 2018-06-15 ENCOUNTER — Other Ambulatory Visit: Payer: Self-pay

## 2018-06-15 DIAGNOSIS — R197 Diarrhea, unspecified: Secondary | ICD-10-CM | POA: Diagnosis present

## 2018-06-15 DIAGNOSIS — E039 Hypothyroidism, unspecified: Secondary | ICD-10-CM | POA: Insufficient documentation

## 2018-06-15 DIAGNOSIS — K529 Noninfective gastroenteritis and colitis, unspecified: Secondary | ICD-10-CM | POA: Insufficient documentation

## 2018-06-15 DIAGNOSIS — Z79899 Other long term (current) drug therapy: Secondary | ICD-10-CM | POA: Insufficient documentation

## 2018-06-15 DIAGNOSIS — Z7982 Long term (current) use of aspirin: Secondary | ICD-10-CM | POA: Insufficient documentation

## 2018-06-15 LAB — COMPREHENSIVE METABOLIC PANEL
ALT: 12 U/L (ref 0–44)
AST: 17 U/L (ref 15–41)
Albumin: 3.8 g/dL (ref 3.5–5.0)
Alkaline Phosphatase: 84 U/L (ref 38–126)
Anion gap: 6 (ref 5–15)
BUN: 12 mg/dL (ref 8–23)
CALCIUM: 8.4 mg/dL — AB (ref 8.9–10.3)
CO2: 28 mmol/L (ref 22–32)
CREATININE: 0.5 mg/dL (ref 0.44–1.00)
Chloride: 108 mmol/L (ref 98–111)
Glucose, Bld: 113 mg/dL — ABNORMAL HIGH (ref 70–99)
Potassium: 3.3 mmol/L — ABNORMAL LOW (ref 3.5–5.1)
Sodium: 142 mmol/L (ref 135–145)
Total Bilirubin: 0.7 mg/dL (ref 0.3–1.2)
Total Protein: 6.3 g/dL — ABNORMAL LOW (ref 6.5–8.1)

## 2018-06-15 LAB — CBC
HCT: 37 % (ref 35.0–47.0)
HEMOGLOBIN: 12.5 g/dL (ref 12.0–16.0)
MCH: 29.2 pg (ref 26.0–34.0)
MCHC: 33.8 g/dL (ref 32.0–36.0)
MCV: 86.4 fL (ref 80.0–100.0)
Platelets: 245 10*3/uL (ref 150–440)
RBC: 4.29 MIL/uL (ref 3.80–5.20)
RDW: 13.6 % (ref 11.5–14.5)
WBC: 6.8 10*3/uL (ref 3.6–11.0)

## 2018-06-15 MED ORDER — IOHEXOL 300 MG/ML  SOLN
75.0000 mL | Freq: Once | INTRAMUSCULAR | Status: AC | PRN
  Administered 2018-06-15: 75 mL via INTRAVENOUS

## 2018-06-15 MED ORDER — AMOXICILLIN-POT CLAVULANATE 875-125 MG PO TABS: 1.0000 | ORAL_TABLET | Freq: Two times a day (BID) | ORAL | 0 refills | Status: DC

## 2018-06-15 MED ORDER — POTASSIUM CHLORIDE CRYS ER 20 MEQ PO TBCR
40.0000 meq | EXTENDED_RELEASE_TABLET | Freq: Once | ORAL | Status: AC
  Administered 2018-06-15: 40 meq via ORAL
  Filled 2018-06-15: qty 2

## 2018-06-15 MED ORDER — AMOXICILLIN-POT CLAVULANATE 875-125 MG PO TABS: 1.0000 | ORAL_TABLET | Freq: Two times a day (BID) | ORAL | 0 refills | Status: AC

## 2018-06-15 NOTE — ED Provider Notes (Signed)
[facility] Emergency Department Provider Note  ____________________________________________   I have reviewed the triage vital signs and the nursing notes.   HISTORY  Chief Complaint Diarrhea, rectal bleed History limited by: Not Limited   HPI Monica Newman is a 84 y.o. female who presents to the emergency department today because of concern for diarrhea and bloody stool. The patient states that she first noticed diarrhea 3 days ago. Has a couple of episodes per day. Did have some abdominal discomfort with the diarrhea, somewhat generalized but slightly worse on the left side. She has noticed some blood since the diarrhea has started but states that is not unusual for her since she has hemorrhoids. This morning she had more blood than she was used to.    Per medical record review patient has a history of gerd  Past Medical History:  Diagnosis Date  . Anxiety   . Arrhythmia   . Arthritis    hands, knees and back  . Back pain    chronic  . GERD (gastroesophageal reflux disease)   . Thyroid disease     Patient Active Problem List   Diagnosis Date Noted  . Acquired hypothyroidism 09/26/2015  . Blood pressure elevated 09/26/2015  . Bloodgood disease 09/26/2015  . Gastro-esophageal reflux disease without esophagitis 09/26/2015  . Anxiety, generalized 09/26/2015  . HLD (hyperlipidemia) 09/26/2015  . Decreased leukocytes 09/26/2015  . Degeneration macular 09/26/2015  . Degenerative arthritis of lumbar spine 09/26/2015    Past Surgical History:  Procedure Laterality Date  . ABDOMINAL HYSTERECTOMY     total for endometriosis  . BREAST BIOPSY Right 11/14/90   benign  . CARDIAC SURGERY     catheterization  . COLONOSCOPY    . DG  BONE DENSITY ([facility] HX)  2012  . EYE SURGERY     bilateral catatact  . INNER EAR SURGERY Bilateral    cochlear implants  . WRIST SURGERY Right     Prior to Admission medications   Medication Sig Start Date End  Date Taking? Authorizing Provider  ALPRAZolam Prudy Feeler(XANAX) 0.25 MG tablet Take 0.25 mg by mouth at bedtime as needed for anxiety.    [provider]  aspirin 81 MG tablet Take 81 mg by mouth daily.    [provider]  calcium acetate (PHOSLO) 667 MG capsule Take by mouth 3 (three) times daily with meals.    [provider]  cephALEXin (KEFLEX) 500 MG capsule Take 1 capsule (500 mg total) by mouth 2 (two) times daily. 04/25/18   Lutricia Feiloemer, William P, PA-C  levothyroxine (SYNTHROID, LEVOTHROID) 75 MCG tablet Take 75 mcg by mouth daily before breakfast.    [provider]  Multiple Vitamins-Minerals (RA VISION-VITE PRESERVE PO) Take by mouth.    [provider]  nitrofurantoin, macrocrystal-monohydrate, (MACROBID) 100 MG capsule Take 1 capsule (100 mg total) by mouth 2 (two) times daily. 11/21/15   Hassan RowanWade, Eugene, MD  omeprazole (PRILOSEC) 40 MG capsule Take 40 mg by mouth daily.    [provider]  phenazopyridine (PYRIDIUM) 200 MG tablet Take 1 tablet (200 mg total) by mouth 3 (three) times daily. 04/25/18   Lutricia Feiloemer, William P, PA-C  TURMERIC PO Take by mouth.    [provider]    Allergies Raloxifene  Family History  Problem Relation Age of Onset  . Breast cancer Sister 8080        2 times  age 82 and 5086  . CAD Father     Social  History Social History   Tobacco Use  . Smoking status: Never Smoker  . Smokeless tobacco: Never Used  Substance Use Topics  . Alcohol use: No  . Drug use: No    Review of Systems Constitutional: No fever/chills Eyes: No visual changes. ENT: No sore throat. Cardiovascular: Denies chest pain. Respiratory: Denies shortness of breath. Gastrointestinal: Positive for abdominal pain, gi bleed, diarrhea.  Genitourinary: Negative for dysuria. Musculoskeletal: Negative for back pain. Skin: Negative for rash. Neurological: Negative for headaches, focal weakness or  numbness.  ____________________________________________   PHYSICAL EXAM:  VITAL SIGNS: ED Triage Vitals  Enc Vitals Group     BP 06/15/18 0615 (!) 149/79     Pulse Rate 06/15/18 0615 97     Resp 06/15/18 0615 (!) 78     Temp 06/15/18 0615 97.8 F (36.6 C)     Temp Source 06/15/18 0615 Oral     SpO2 06/15/18 0615 97 %     Weight 06/15/18 0616 121 lb (54.9 kg)     Height 06/15/18 0616 5\' 3"  (1.6 m)     Head Circumference --      Peak Flow --      Pain Score 06/15/18 0616 0    Constitutional: Alert and oriented.  Eyes: Conjunctivae are normal.  ENT      Head: Normocephalic and atraumatic.      Nose: No congestion/rhinnorhea.      Mouth/Throat: Mucous membranes are moist.      Neck: No stridor. Hematological/Lymphatic/Immunilogical: No cervical lymphadenopathy. Cardiovascular: Normal rate, regular rhythm.  No murmurs, rubs, or gallops.  Respiratory: Normal respiratory effort without tachypnea nor retractions. Breath sounds are clear and equal bilaterally. No wheezes/rales/rhonchi. Gastrointestinal: Soft and tender to palpation in the left lower quadrant. No rebound. No guarding.  Genitourinary: Deferred Musculoskeletal: Normal range of motion in all extremities. No lower extremity edema. Neurologic:  Normal speech and language. No gross focal neurologic deficits are appreciated.  Skin:  Skin is warm, dry and intact. No rash noted. Psychiatric: Mood and affect are normal. Speech and behavior are normal. Patient exhibits appropriate insight and judgment.  ____________________________________________    LABS (pertinent positives/negatives)  CBC wnl CMP na 143, k 3.3, glu 113, cr 0.50  ____________________________________________   EKG  None  ____________________________________________    RADIOLOGY  CT abd/pel Infection or inflammatory process of the sigmoid  colon.  ____________________________________________   PROCEDURES  Procedures  ____________________________________________   INITIAL IMPRESSION / ASSESSMENT AND PLAN / ED COURSE  Pertinent labs & imaging results that were available during my care of the patient were reviewed by me and considered in my medical decision making (see chart for details).   Patient presented to the emergency department today because of concerns for GI bleeding and diarrhea.  She did have some tenderness to the left side.  Stated she did have a history of diverticulosis.  CT scan was obtained which did not show evidence of true diverticulosis however did show some inflammation or infection of the colon.  Will plan on placing patient on antibiotics.  Discussed findings and plan with patient.   ____________________________________________   FINAL CLINICAL IMPRESSION(S) / ED DIAGNOSES  Final diagnoses:  Colitis     Note: This dictation was prepared with Dragon dictation. Any transcriptional errors that result from this process are unintentional     Phineas Semen, MD 06/15/18 [id number]

## 2018-06-15 NOTE — ED Notes (Signed)
Pt discharged home after verbalizing understanding of discharge instructions; nad noted. 

## 2018-06-15 NOTE — ED Notes (Addendum)
Reports bloody diarrhea since Friday. States she has hemorrhoids and is used to having some blood when she has loose or frequent stools, but that this seems to be worse. Pt reports recent UTI, just finished antibiotics. Pt alert & oriented with NAD noted. HOH.  Pt reports 2 episodes of loose stool in last 24 hours. She reports several episodes Friday, Saturday was more normal, and then returned last night.

## 2018-06-15 NOTE — Discharge Instructions (Addendum)
Please seek medical attention for any high fevers, chest pain, shortness of breath, change in behavior, persistent vomiting, bloody stool or any other new or concerning symptoms.  

## 2018-06-15 NOTE — ED Triage Notes (Signed)
Patient with bloody diarrhea. Seen in stool and when she wipes. Is not constant and states "it just comes in spurts." Patient HOH and has forgotten her hearing aids. Son will return with them. Patient's skin is warm/dry and normal color for ethnicity.

## 2018-06-28 ENCOUNTER — Other Ambulatory Visit: Payer: Self-pay

## 2018-06-28 ENCOUNTER — Telehealth: Payer: Self-pay | Admitting: Family Medicine

## 2018-06-28 ENCOUNTER — Ambulatory Visit
Admission: EM | Admit: 2018-06-28 | Discharge: 2018-06-28 | Disposition: A | Discharge status: Home / Self Care | Payer: Medicare Other | Attending: Family Medicine | Admitting: Family Medicine

## 2018-06-28 ENCOUNTER — Encounter: Payer: Self-pay | Admitting: Emergency Medicine

## 2018-06-28 DIAGNOSIS — E785 Hyperlipidemia, unspecified: Secondary | ICD-10-CM | POA: Insufficient documentation

## 2018-06-28 DIAGNOSIS — R197 Diarrhea, unspecified: Secondary | ICD-10-CM | POA: Insufficient documentation

## 2018-06-28 DIAGNOSIS — M4686 Other specified inflammatory spondylopathies, lumbar region: Secondary | ICD-10-CM | POA: Diagnosis not present

## 2018-06-28 DIAGNOSIS — Z79890 Hormone replacement therapy (postmenopausal): Secondary | ICD-10-CM | POA: Insufficient documentation

## 2018-06-28 DIAGNOSIS — A0472 Enterocolitis due to Clostridium difficile, not specified as recurrent: Secondary | ICD-10-CM | POA: Diagnosis not present

## 2018-06-28 DIAGNOSIS — K219 Gastro-esophageal reflux disease without esophagitis: Secondary | ICD-10-CM | POA: Insufficient documentation

## 2018-06-28 DIAGNOSIS — Z79899 Other long term (current) drug therapy: Secondary | ICD-10-CM | POA: Diagnosis not present

## 2018-06-28 DIAGNOSIS — F419 Anxiety disorder, unspecified: Secondary | ICD-10-CM | POA: Diagnosis not present

## 2018-06-28 DIAGNOSIS — E039 Hypothyroidism, unspecified: Secondary | ICD-10-CM | POA: Diagnosis not present

## 2018-06-28 DIAGNOSIS — Z7982 Long term (current) use of aspirin: Secondary | ICD-10-CM | POA: Insufficient documentation

## 2018-06-28 LAB — C DIFFICILE QUICK SCREEN W PCR REFLEX
C DIFFICILE (CDIFF) TOXIN: NEGATIVE
C DIFFICLE (CDIFF) ANTIGEN: POSITIVE — AB

## 2018-06-28 LAB — CLOSTRIDIUM DIFFICILE BY PCR, REFLEXED: Toxigenic C. Difficile by PCR: POSITIVE — AB

## 2018-06-28 MED ORDER — VANCOMYCIN HCL 125 MG PO CAPS: 125.0000 mg | ORAL_CAPSULE | Freq: Four times a day (QID) | ORAL | 0 refills | Status: AC

## 2018-06-28 NOTE — ED Provider Notes (Addendum)
[facility]    CSN: [id number] Arrival date & time: 06/28/18  4098  History   Chief Complaint Chief Complaint  Patient presents with  . Diarrhea   HPI  84 year old female presents with diarrhea.  Patient has had multiple antibiotic courses recently.  She is been treated for UTI 3 times since June.  She has had 2 courses of Keflex and a course of Bactrim.  She was seen in the ER on 7/22 and was diagnosed with colitis.  C. difficile testing was not done.  She was placed on Augmentin.  She states that she improved following the Augmentin.  However, her diarrhea has resumed as of 2 days ago.  She states that is not particularly foul-smelling.  It is watery.  No reports of abdominal pain.  No fevers or chills.  No known relieving factors.  Patient is concerned about her recurrence of diarrhea.  She is concerned that she may need more antibodies.  No other associated symptoms.  No other complaints.  Past Medical History:  Diagnosis Date  . Anxiety   . Arrhythmia   . Arthritis    hands, knees and back  . Back pain    chronic  . GERD (gastroesophageal reflux disease)   . Thyroid disease    Patient Active Problem List   Diagnosis Date Noted  . Acquired hypothyroidism 09/26/2015  . Blood pressure elevated 09/26/2015  . Bloodgood disease 09/26/2015  . Gastro-esophageal reflux disease without esophagitis 09/26/2015  . Anxiety, generalized 09/26/2015  . HLD (hyperlipidemia) 09/26/2015  . Decreased leukocytes 09/26/2015  . Degeneration macular 09/26/2015  . Degenerative arthritis of lumbar spine 09/26/2015   Past Surgical History:  Procedure Laterality Date  . ABDOMINAL HYSTERECTOMY     total for endometriosis  . BREAST BIOPSY Right 11/14/90   benign  . CARDIAC SURGERY     catheterization  . COLONOSCOPY    . DG  BONE DENSITY ([facility] HX)  2012  . EYE SURGERY     bilateral catatact  . INNER EAR SURGERY Bilateral    cochlear implants  . WRIST SURGERY Right    OB  History   None    Home Medications    Prior to Admission medications   Medication Sig Start Date End Date Taking? Authorizing Provider  ALPRAZolam Prudy Feeler) 0.25 MG tablet Take 0.25 mg by mouth at bedtime as needed for anxiety.   Yes [provider]  aspirin 81 MG tablet Take 81 mg by mouth daily.   Yes [provider]  calcium acetate (PHOSLO) 667 MG capsule Take by mouth 3 (three) times daily with meals.   Yes [provider]  levothyroxine (SYNTHROID, LEVOTHROID) 75 MCG tablet Take 75 mcg by mouth daily before breakfast.   Yes [provider]  Multiple Vitamins-Minerals (RA VISION-VITE PRESERVE PO) Take by mouth.   Yes [provider]  omeprazole (PRILOSEC) 40 MG capsule Take 40 mg by mouth daily.   Yes [provider]  TURMERIC PO Take by mouth.   Yes [provider]  cephALEXin (KEFLEX) 500 MG capsule Take 1 capsule (500 mg total) by mouth 2 (two) times daily. 04/25/18   Lutricia Feil, PA-C  nitrofurantoin, macrocrystal-monohydrate, (MACROBID) 100 MG capsule Take 1 capsule (100 mg total) by mouth 2 (two) times daily. 11/21/15   Hassan Rowan, MD  phenazopyridine (PYRIDIUM) 200 MG tablet Take 1 tablet (200 mg total) by mouth 3 (three) times daily. 04/25/18   Lutricia Feil, PA-C   Family  History Family History  Problem Relation Age of Onset  . Breast cancer Sister 47        2 times  age 30 and 9  . CAD Father    Social History Social History   Tobacco Use  . Smoking status: Never Smoker  . Smokeless tobacco: Never Used  Substance Use Topics  . Alcohol use: No  . Drug use: No    Allergies   Raloxifene  Review of Systems Review of Systems  Constitutional: Negative.   Gastrointestinal: Positive for diarrhea. Negative for abdominal pain and anal bleeding.   Physical Exam Triage Vital Signs ED Triage Vitals  Enc Vitals Group     BP 06/28/18 0851 (!) 166/77     Pulse Rate 06/28/18 0851 73     Resp  06/28/18 0851 16     Temp 06/28/18 0851 97.8 F (36.6 C)     Temp Source 06/28/18 0851 Oral     SpO2 06/28/18 0851 97 %     Weight 06/28/18 0849 121 lb 8 oz (55.1 kg)     Height 06/28/18 0849 5\' 3"  (1.6 m)     Head Circumference --      Peak Flow --      Pain Score 06/28/18 0849 0     Pain Loc --      Pain Edu? --      Excl. in GC? --     Updated Vital Signs BP (!) 166/77 (BP Location: Left Arm)   Pulse 73   Temp 97.8 F (36.6 C) (Oral)   Resp 16   Ht 5\' 3"  (1.6 m)   Wt 121 lb 8 oz (55.1 kg)   SpO2 97%   BMI 21.52 kg/m   Visual Acuity Right Eye Distance:   Left Eye Distance:   Bilateral Distance:    Right Eye Near:   Left Eye Near:    Bilateral Near:     Physical Exam  Constitutional: She is oriented to person, place, and time. She appears well-developed. No distress.  Cardiovascular: Normal rate and regular rhythm.  Pulmonary/Chest: Effort normal and breath sounds normal. She has no wheezes. She has no rales.  Abdominal: Soft. She exhibits no distension. There is no tenderness.  Neurological: She is alert and oriented to person, place, and time.  Psychiatric: She has a normal mood and affect. Her behavior is normal.  Nursing note and vitals reviewed.   UC Treatments / Results  Labs (all labs ordered are listed, but only abnormal results are displayed) Labs Reviewed  C DIFFICILE QUICK SCREEN W PCR REFLEX - Abnormal; Notable for the following components:      Result Value   C Diff antigen POSITIVE (*)    All other components within normal limits  CLOSTRIDIUM DIFFICILE BY PCR, REFLEXED - Abnormal; Notable for the following components:   Toxigenic C. Difficile by PCR POSITIVE (*)    All other components within normal limits    EKG None  Radiology No results found.  Procedures Procedures (including critical care time)  Medications Ordered in UC Medications - No data to display  Initial Impression / Assessment and Plan / UC Course  I have reviewed  the triage vital signs and the nursing notes.  Pertinent labs & imaging results that were available during my care of the patient were reviewed by me and considered in my medical decision making (see chart for details).    84 year old female presents with diarrhea.  Concern for C. difficile  given multiple courses of antibiotic.  Patient gave a stool sample here.  Will await results.  We will treat accordingly following the result.  ** C diff positive. Treating with PO Vanc.  Final Clinical Impressions(s) / UC Diagnoses   Final diagnoses:  Diarrhea of presumed infectious origin     Discharge Instructions     We will call with the results.  Take care  Dr. Adriana Simasook     ED Prescriptions    None     Controlled Substance Prescriptions [place] Controlled Substance Registry consulted? Not Applicable   Tommie SamsCook, Suzzette Gasparro G, DO 06/28/18 1003    [address]Cook, Highland MeadowsJayce G, [place]DO 06/28/18 1304

## 2018-06-28 NOTE — Discharge Instructions (Signed)
We will call with the results.  Take care  Dr. Eros Montour  

## 2018-06-28 NOTE — Telephone Encounter (Signed)
C diff positive.  Starting on PO Vanc.

## 2018-06-28 NOTE — ED Triage Notes (Signed)
Patient c/o diarrhea that started yesterday. Patient was seen on 07/22 for diarrhea and diagnosed with Colitis. Patient was given Augmentin and finished this last Wednesday. Patients stools returned to normal until yesterday.

## 2019-09-13 ENCOUNTER — Other Ambulatory Visit: Payer: Self-pay | Admitting: Family Medicine

## 2019-09-13 DIAGNOSIS — Z1231 Encounter for screening mammogram for malignant neoplasm of breast: Secondary | ICD-10-CM

## 2019-11-07 IMAGING — MG MM DIGITAL DIAGNOSTIC UNILAT*R* W/ TOMO W/ CAD
4 series · 4 of 12 positions shown · non-contrast
Comparison: Previous exam(s).

CLINICAL DATA: Follow-up of probably benign right breast 11 o'clock
mass, thought to represent intramammary hematoma.

EXAM:
DIGITAL DIAGNOSTIC RIGHT MAMMOGRAM WITH CAD AND TOMO
ULTRASOUND RIGHT BREAST

[R CC synth-2D]
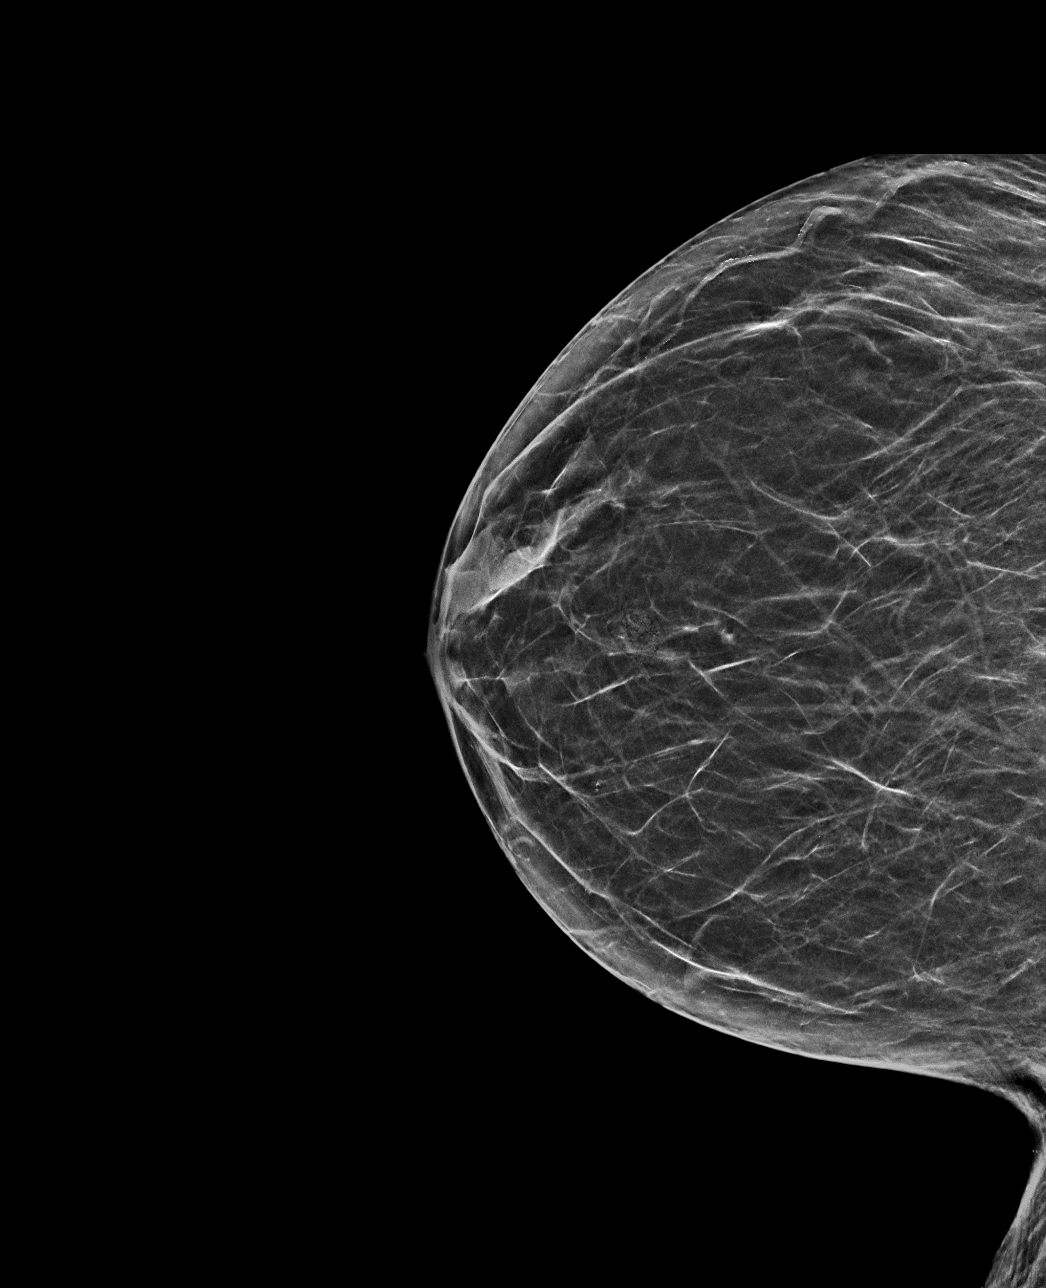

[R MLO synth-2D]
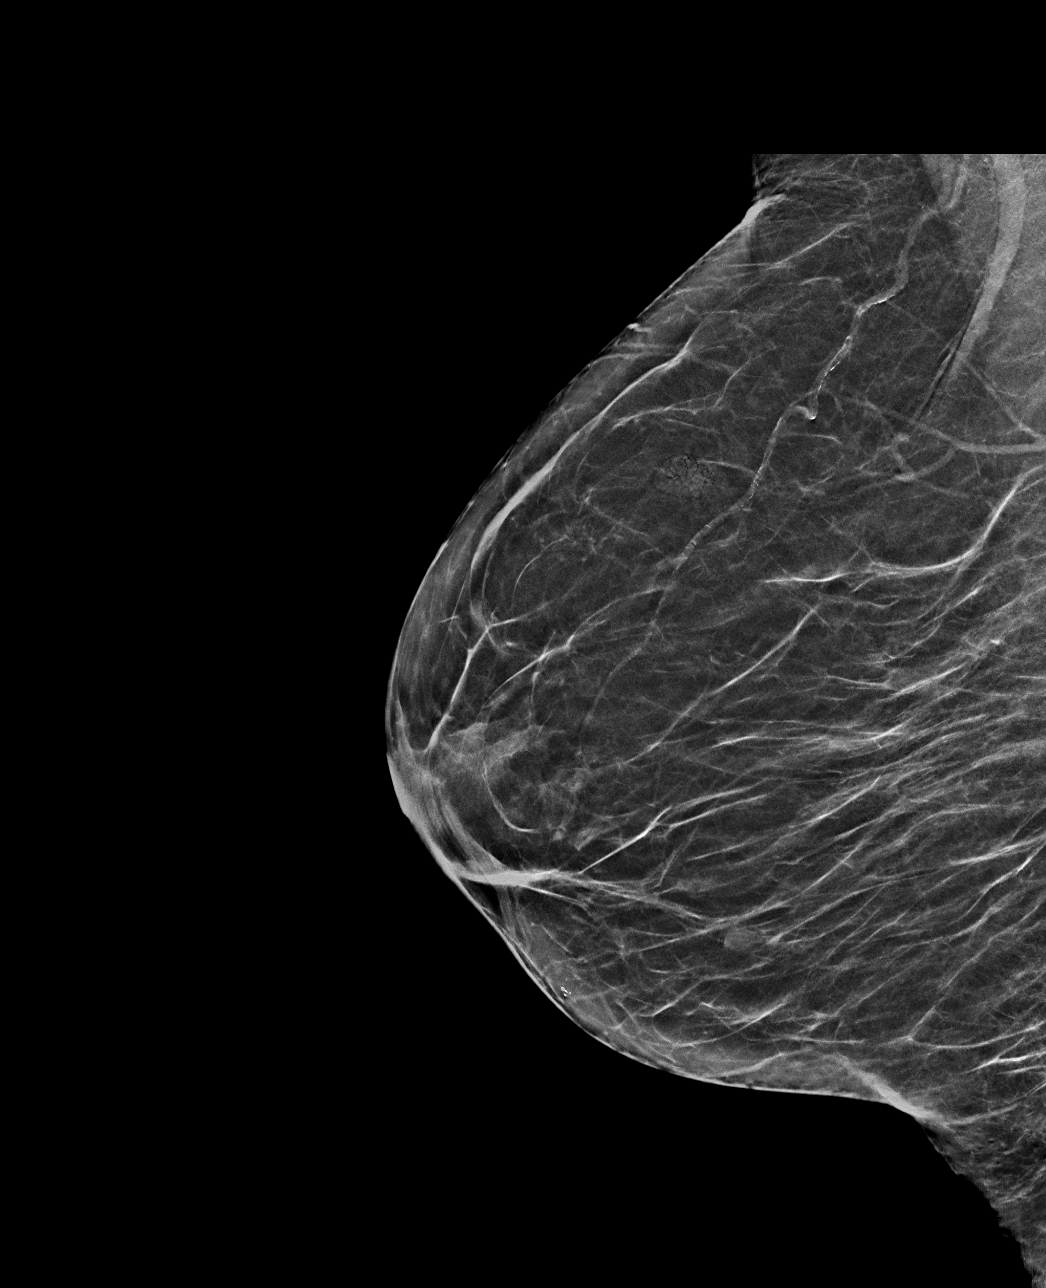

[R MLO tomo · tomo slice 26/51.0]
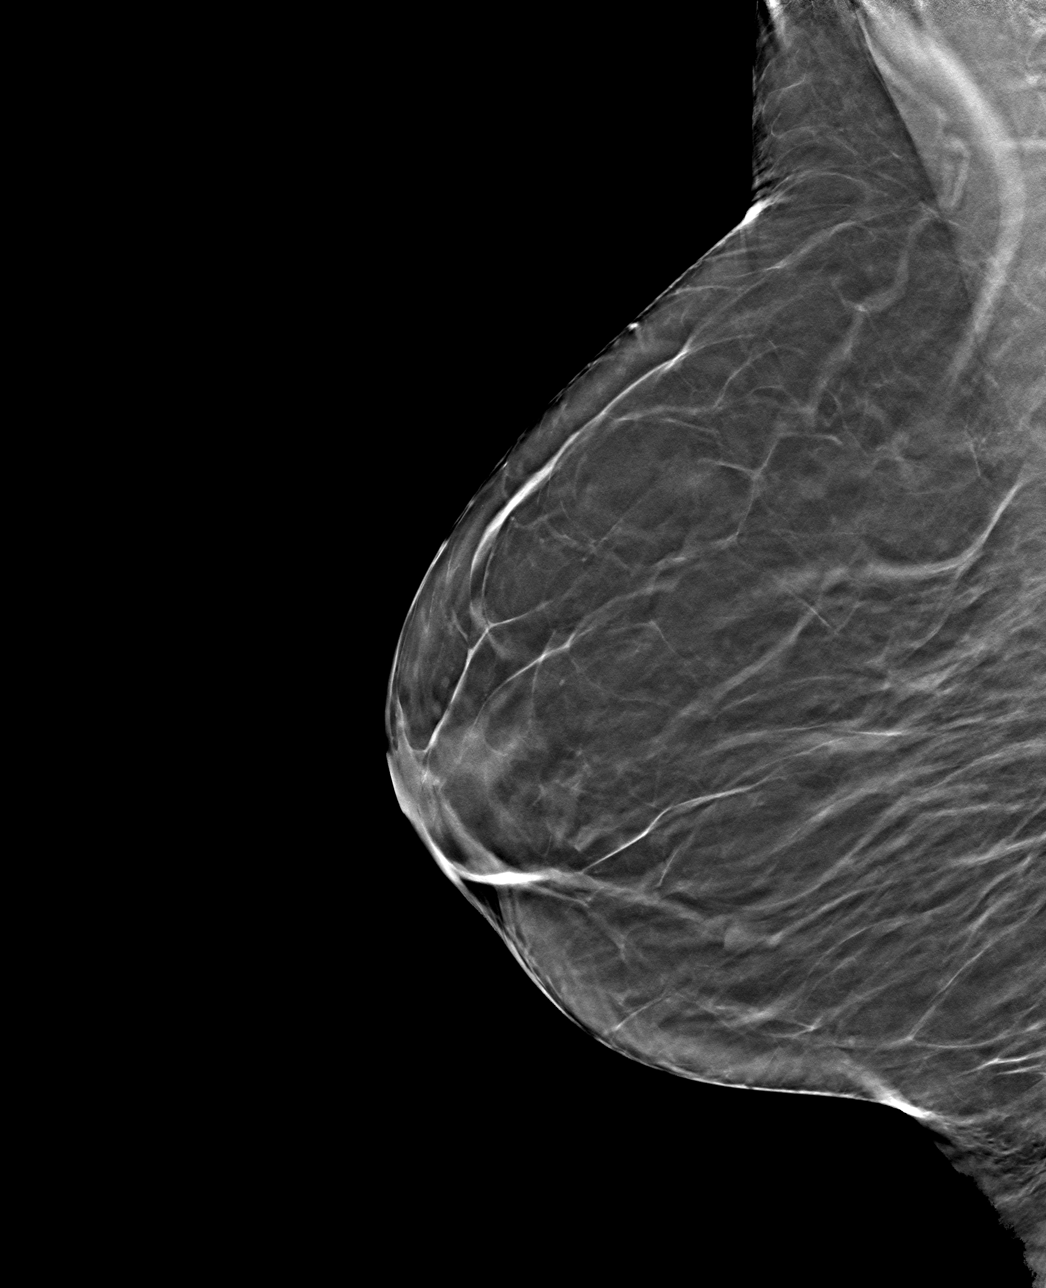

[R CC tomo · tomo slice 25/49.0]
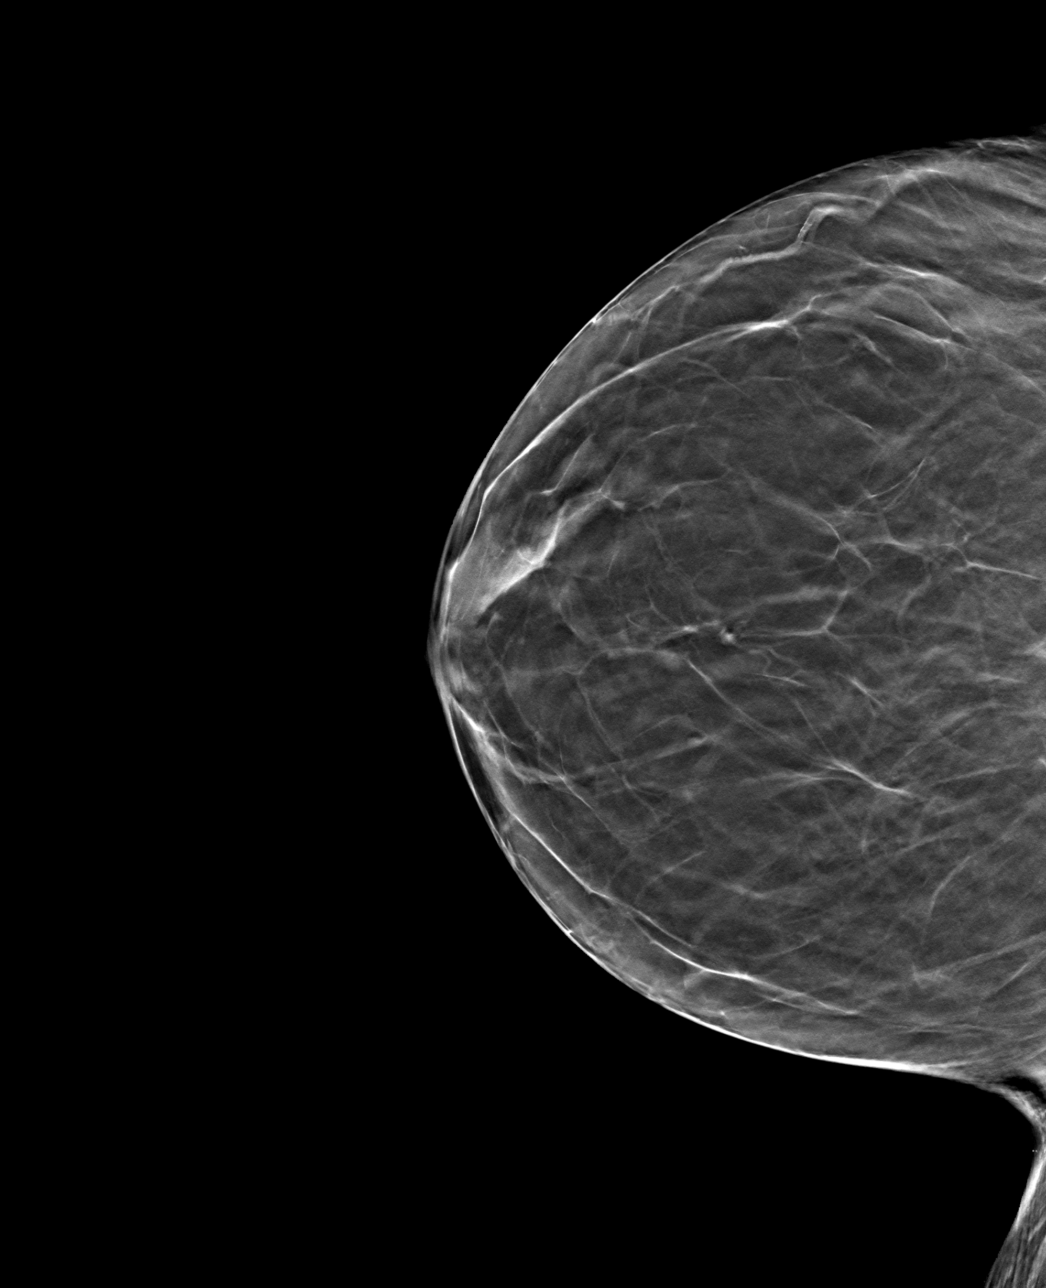

[4 of 12 positions shown; findings below may reference images not displayed]

ACR Breast Density Category b: There are scattered areas of
fibroglandular density.
FINDINGS: Mammographically, there are no suspicious masses, areas of
architectural distortion or microcalcifications in the right breast.
The previously seen non mass low-density focal asymmetry in the
right breast upper outer quadrant has resolved. Stable postsurgical
changes from prior benign excisional biopsy of the right breast.

Mammographic images were processed with CAD.

On physical exam, no suspicious masses are palpated.

Targeted ultrasound is performed, showing no suspicious masses or
shadowing lesions. The sonographic abnormality at 11 o'clock has
resolved as well.
IMPRESSION: No mammographic sonographic evidence of malignancy in the right
breast.

RECOMMENDATION:
Patient is due for annual screening mammography in February 2019

I have discussed the findings and recommendations with the patient.
Results were also provided in writing at the conclusion of the
visit. If applicable, a reminder letter will be sent to the patient
regarding the next appointment.

BI-RADS CATEGORY  1: Negative.

## 2019-11-29 ENCOUNTER — Other Ambulatory Visit: Payer: Self-pay

## 2019-11-29 ENCOUNTER — Ambulatory Visit
Admission: RE | Admit: 2019-11-29 | Discharge: 2019-11-29 | Disposition: A | Discharge status: Home / Self Care | Payer: Medicare Other | Source: Ambulatory Visit | Attending: Family Medicine | Admitting: Family Medicine

## 2019-11-29 DIAGNOSIS — Z1231 Encounter for screening mammogram for malignant neoplasm of breast: Secondary | ICD-10-CM | POA: Diagnosis present

## 2021-01-08 ENCOUNTER — Other Ambulatory Visit: Payer: Self-pay | Admitting: Family Medicine

## 2021-01-08 DIAGNOSIS — Z1231 Encounter for screening mammogram for malignant neoplasm of breast: Secondary | ICD-10-CM

## 2021-01-24 ENCOUNTER — Other Ambulatory Visit: Payer: Self-pay

## 2021-01-24 ENCOUNTER — Ambulatory Visit
Admission: RE | Admit: 2021-01-24 | Discharge: 2021-01-24 | Disposition: A | Discharge status: Home / Self Care | Payer: Medicare Other | Source: Ambulatory Visit | Attending: Family Medicine | Admitting: Family Medicine

## 2021-01-24 DIAGNOSIS — Z1231 Encounter for screening mammogram for malignant neoplasm of breast: Secondary | ICD-10-CM | POA: Insufficient documentation

## 2021-12-11 ENCOUNTER — Other Ambulatory Visit: Payer: Self-pay | Admitting: Family Medicine

## 2021-12-11 DIAGNOSIS — Z1231 Encounter for screening mammogram for malignant neoplasm of breast: Secondary | ICD-10-CM

## 2022-01-29 ENCOUNTER — Ambulatory Visit
Admission: RE | Admit: 2022-01-29 | Discharge: 2022-01-29 | Disposition: A | Discharge status: Home / Self Care | Payer: Medicare Other | Source: Ambulatory Visit | Attending: Family Medicine | Admitting: Family Medicine

## 2022-01-29 ENCOUNTER — Other Ambulatory Visit: Payer: Self-pay

## 2022-01-29 DIAGNOSIS — Z1231 Encounter for screening mammogram for malignant neoplasm of breast: Secondary | ICD-10-CM | POA: Insufficient documentation

## 2022-12-16 ENCOUNTER — Other Ambulatory Visit: Payer: Self-pay | Admitting: Family Medicine

## 2022-12-16 DIAGNOSIS — Z1231 Encounter for screening mammogram for malignant neoplasm of breast: Secondary | ICD-10-CM

## 2023-02-03 ENCOUNTER — Ambulatory Visit
Admission: RE | Admit: 2023-02-03 | Discharge: 2023-02-03 | Disposition: A | Discharge status: Home / Self Care | Payer: Medicare Other | Source: Ambulatory Visit | Attending: Family Medicine | Admitting: Family Medicine

## 2023-02-03 DIAGNOSIS — Z1231 Encounter for screening mammogram for malignant neoplasm of breast: Secondary | ICD-10-CM

## 2023-12-22 ENCOUNTER — Other Ambulatory Visit: Payer: Self-pay | Admitting: Family Medicine

## 2023-12-22 DIAGNOSIS — Z1231 Encounter for screening mammogram for malignant neoplasm of breast: Secondary | ICD-10-CM

## 2024-02-04 ENCOUNTER — Ambulatory Visit
Admission: RE | Admit: 2024-02-04 | Discharge: 2024-02-04 | Disposition: A | Discharge status: Home / Self Care | Payer: Medicare Other | Source: Ambulatory Visit | Attending: Family Medicine | Admitting: Family Medicine

## 2024-02-04 DIAGNOSIS — Z1231 Encounter for screening mammogram for malignant neoplasm of breast: Secondary | ICD-10-CM | POA: Diagnosis present

## 2024-02-09 ENCOUNTER — Ambulatory Visit
Admission: RE | Admit: 2024-02-09 | Discharge: 2024-02-09 | Disposition: A | Discharge status: Home / Self Care | Payer: Self-pay | Source: Ambulatory Visit | Attending: Emergency Medicine | Admitting: Emergency Medicine

## 2024-02-09 VITALS — BP 185/114 | HR 96 | Temp 98.1°F | Resp 15

## 2024-02-09 DIAGNOSIS — J029 Acute pharyngitis, unspecified: Secondary | ICD-10-CM | POA: Diagnosis not present

## 2024-02-09 LAB — GROUP A STREP BY PCR: Group A Strep by PCR: NOT DETECTED

## 2024-02-09 MED ORDER — IPRATROPIUM BROMIDE 0.06 % NA SOLN: 2.0000 | Freq: Four times a day (QID) | NASAL | 12 refills | Status: AC

## 2024-02-09 MED ORDER — BENZONATATE 100 MG PO CAPS: 200.0000 mg | ORAL_CAPSULE | Freq: Three times a day (TID) | ORAL | 0 refills | Status: AC

## 2024-02-09 NOTE — ED Triage Notes (Signed)
 Sx since Wednesday   Sore throat  Small cough when she goes to bed.   Taking tylenol cold and sinus

## 2024-02-09 NOTE — Discharge Instructions (Addendum)
 Your strep test today was negative.  You do have postnasal drip present on your exam which may be leading to your sore throat.  Use the Atrovent nasal spray, 2 squirts in each nostril every 6 hours, as needed for postnasal drip.  I we will also prescribe Tessalon Perles which she can use to help calm down the cough reflex.  You can use these every 8 hours, or you may just take them at bedtime to help you with cough since your cough only affects you when you lay down to sleep.  You may soothe your throat by using salt water gargles or over-the-counter Chloraseptic or Sucrets lozenges.  No more than 1 lozenge every 2 hours as the menthol may give you diarrhea.  If you develop any new or worsening symptoms either return for reevaluation or follow-up with your primary care provider.

## 2024-02-09 NOTE — ED Provider Notes (Signed)
 [facility]    CSN: [id number] Arrival date & time: 02/09/24  0804      History   Chief Complaint Chief Complaint  Patient presents with   Sore Throat    Entered by patient    HPI Monica Newman is a 89 y.o. female.   HPI  89 year old female with past medical history significant for thyroid disease, GERD, chronic back pain, arthritis, arrhythmia, and anxiety presents for evaluation of sore throat that started 5 days ago.  She also reports that when she lays down at night to go to bed she develops a slight, nonproductive cough.  She denies any fever, runny nose, nasal congestion, ear pain, shortness breath, or wheezing.  No known sick contacts.  Patient does have an elevated blood pressure in clinic and reports that her blood pressure is always elevated when she goes to the doctor.  Past Medical History:  Diagnosis Date   Anxiety    Arrhythmia    Arthritis    hands, knees and back   Back pain    chronic   GERD (gastroesophageal reflux disease)    Thyroid disease     Patient Active Problem List   Diagnosis Date Noted   Acquired hypothyroidism 09/26/2015   Blood pressure elevated 09/26/2015   Bloodgood disease 09/26/2015   Gastro-esophageal reflux disease without esophagitis 09/26/2015   Anxiety, generalized 09/26/2015   HLD (hyperlipidemia) 09/26/2015   Decreased leukocytes 09/26/2015   Degeneration macular 09/26/2015   Degenerative arthritis of lumbar spine 09/26/2015    Past Surgical History:  Procedure Laterality Date   ABDOMINAL HYSTERECTOMY     total for endometriosis   BREAST BIOPSY Right 11/14/90   benign   CARDIAC SURGERY     catheterization   COLONOSCOPY     DG  BONE DENSITY ([facility] HX)  2012   EYE SURGERY     bilateral catatact   INNER EAR SURGERY Bilateral    cochlear implants   WRIST SURGERY Right     OB History   No obstetric history on file.      Home Medications    Prior to Admission medications   Medication Sig  Start Date End Date Taking? Authorizing Provider  benzonatate (TESSALON) 100 MG capsule Take 2 capsules (200 mg total) by mouth every 8 (eight) hours. 02/09/24  Yes Becky Augusta, NP  ipratropium (ATROVENT) 0.06 % nasal spray Place 2 sprays into both nostrils 4 (four) times daily. 02/09/24  Yes Becky Augusta, NP  ALPRAZolam Prudy Feeler) 0.25 MG tablet Take 0.25 mg by mouth at bedtime as needed for anxiety.   Yes [provider]  aspirin 81 MG tablet Take 81 mg by mouth daily.    [provider]  calcium acetate (PHOSLO) 667 MG capsule Take by mouth 3 (three) times daily with meals.   Yes [provider]  cephALEXin (KEFLEX) 500 MG capsule Take 1 capsule (500 mg total) by mouth 2 (two) times daily. 04/25/18   Lutricia Feil, PA-C  levothyroxine (SYNTHROID, LEVOTHROID) 75 MCG tablet Take 75 mcg by mouth daily before breakfast.   Yes [provider]  Multiple Vitamins-Minerals (RA VISION-VITE PRESERVE PO) Take by mouth.   Yes [provider]  nitrofurantoin, macrocrystal-monohydrate, (MACROBID) 100 MG capsule Take 1 capsule (100 mg total) by mouth 2 (two) times daily. 11/21/15   Hassan Rowan, MD  omeprazole (PRILOSEC) 40 MG capsule Take 40 mg by mouth daily.   Yes [provider]  phenazopyridine (PYRIDIUM) 200 MG tablet  Take 1 tablet (200 mg total) by mouth 3 (three) times daily. 04/25/18   Lutricia Feil, PA-C  TURMERIC PO Take by mouth.   Yes [provider]    Family History Family History  Problem Relation Age of Onset   Breast cancer Sister 33        2 times  age 55 and 35   CAD Father     Social History Social History   Tobacco Use   Smoking status: Never   Smokeless tobacco: Never  Vaping Use   Vaping status: Never Used  Substance Use Topics   Alcohol use: No   Drug use: No     Allergies   Raloxifene   Review of Systems Review of Systems  Constitutional:  Negative for fever.  HENT:  Positive for sore throat.  Negative for congestion, ear pain and rhinorrhea.   Respiratory:  Positive for cough. Negative for shortness of breath and wheezing.      Physical Exam Triage Vital Signs ED Triage Vitals  Encounter Vitals Group     BP      Systolic BP Percentile      Diastolic BP Percentile      Pulse      Resp      Temp      Temp src      SpO2      Weight      Height      Head Circumference      Peak Flow      Pain Score      Pain Loc      Pain Education      Exclude from Growth Chart    No data found.  Updated Vital Signs BP (!) 185/114 (BP Location: Left Arm)   Pulse 96   Temp 98.1 F (36.7 C) (Oral)   Resp 15   SpO2 94%   Visual Acuity Right Eye Distance:   Left Eye Distance:   Bilateral Distance:    Right Eye Near:   Left Eye Near:    Bilateral Near:     Physical Exam Vitals and nursing note reviewed.  Constitutional:      Appearance: Normal appearance. She is not ill-appearing.  HENT:     Head: Normocephalic and atraumatic.     Nose: Nose normal. No congestion or rhinorrhea.     Mouth/Throat:     Mouth: Mucous membranes are moist.     Pharynx: Oropharynx is clear. Posterior oropharyngeal erythema present. No oropharyngeal exudate.     Comments: Tonsillar pillars are unremarkable.  Posterior oropharynx demonstrates mild erythema with clear postnasal drip. Cardiovascular:     Rate and Rhythm: Normal rate and regular rhythm.     Pulses: Normal pulses.     Heart sounds: Normal heart sounds. No murmur heard.    No friction rub. No gallop.  Pulmonary:     Effort: Pulmonary effort is normal.     Breath sounds: Normal breath sounds. No wheezing, rhonchi or rales.  Musculoskeletal:     Cervical back: Normal range of motion and neck supple. No tenderness.  Lymphadenopathy:     Cervical: No cervical adenopathy.  Skin:    General: Skin is warm and dry.     Capillary Refill: Capillary refill takes less than 2 seconds.     Findings: No erythema or rash.  Neurological:      General: No focal deficit present.     Mental Status: She is alert and oriented  to person, place, and time.      UC Treatments / Results  Labs (all labs ordered are listed, but only abnormal results are displayed) Labs Reviewed  GROUP A STREP BY PCR    EKG   Radiology No results found.  Procedures Procedures (including critical care time)  Medications Ordered in UC Medications - No data to display  Initial Impression / Assessment and Plan / UC Course  I have reviewed the triage vital signs and the nursing notes.  Pertinent labs & imaging results that were available during my care of the patient were reviewed by me and considered in my medical decision making (see chart for details).   Patient is a nontoxic-appearing 89 year old female presenting for evaluation of sore throat and nonproductive cough that is only present when she lays down at night as outlined HPI above.  Her physical exam reveals erythema to the posterior pharynx with clear postnasal drip but no hypertrophy, erythema, or exudate upper tonsillar pillars.  Nasopharyngeal exam is benign.  Cardiopulmonary exam reveals clear lung sounds in all fields.  Differential diagnose include COVID, influenza, strep pharyngitis, viral URI.  Given that she has had symptoms for last 5 days I will not test her for COVID or flu at this time but I will order a strep PCR.  Her blood pressure is elevated at 185/114 here in clinic.  Review of records in epic show that the patient has had elevated blood pressure at each visit save 1 over the last 5 years.  She does report that when she was last at her primary care provider's office her systolic was over 200 but her PCP did not start her on any antihypertensives.  She reports that she is going back to him for a follow-up next month.  She denies any headache, dizziness, changes in vision, chest pain, or shortness of breath.  Strep PCR is negative.  I will discharge the patient home with a  diagnosis of pharyngitis and start her on Atrovent nasal spray to help with the postnasal drip, which may be contributing to her sore throat.  I will also prescribe Tessalon Perles that she can use every 8 hours as needed for cough or take them at bedtime.  To soothe the throat she can gargle with warm salt water or use over-the-counter Chloraseptic or Sucrets lozenges.  Return precautions reviewed.   Final Clinical Impressions(s) / UC Diagnoses   Final diagnoses:  Pharyngitis, unspecified etiology     Discharge Instructions      Your strep test today was negative.  You do have postnasal drip present on your exam which may be leading to your sore throat.  Use the Atrovent nasal spray, 2 squirts in each nostril every 6 hours, as needed for postnasal drip.  I we will also prescribe Tessalon Perles which she can use to help calm down the cough reflex.  You can use these every 8 hours, or you may just take them at bedtime to help you with cough since your cough only affects you when you lay down to sleep.  You may soothe your throat by using salt water gargles or over-the-counter Chloraseptic or Sucrets lozenges.  No more than 1 lozenge every 2 hours as the menthol may give you diarrhea.  If you develop any new or worsening symptoms either return for reevaluation or follow-up with your primary care provider.     ED Prescriptions     Medication Sig Dispense Auth. Provider   benzonatate (TESSALON) 100  MG capsule Take 2 capsules (200 mg total) by mouth every 8 (eight) hours. 21 capsule Becky Augusta, NP   ipratropium (ATROVENT) 0.06 % nasal spray Place 2 sprays into both nostrils 4 (four) times daily. 15 mL Becky Augusta, NP      PDMP not reviewed this encounter.   Becky Augusta, NP 02/09/24 [phone]
